# Patient Record
Sex: Male | Born: 2005 | Race: Black or African American | Hispanic: No | Marital: Single
Health system: Southern US, Community
[De-identification: ages and names within clinical notes are randomized; demographics above are authoritative.]

---
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
## 2023-10-06 DIAGNOSIS — H5213 Myopia, bilateral: Secondary | ICD-10-CM | POA: Diagnosis not present

## 2024-01-02 IMAGING — US US SCROTUM W/ DOPPLER COMPLETE
1 series · 13 of 25 positions shown · non-contrast
Comparison: None.

CLINICAL DATA: Right testicular pain for 3 hours, vomiting

EXAM:
SCROTAL ULTRASOUND
DOPPLER ULTRASOUND OF THE TESTICLES
TECHNIQUE: Complete ultrasound examination of the testicles, epididymis, and
other scrotal structures was performed. Color and spectral Doppler
ultrasound were also utilized to evaluate blood flow to the
testicles.

[Series 1: us scrotum w/doppler · 13 of 83 slices shown]
[im 1/83]
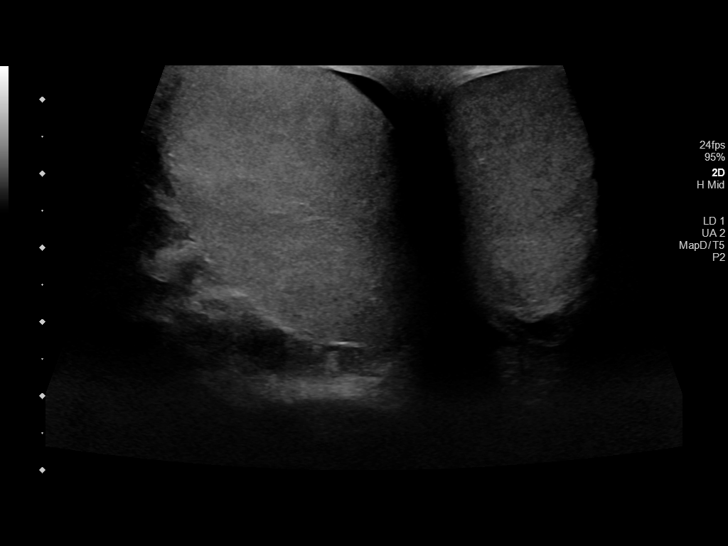
[im 7/83]
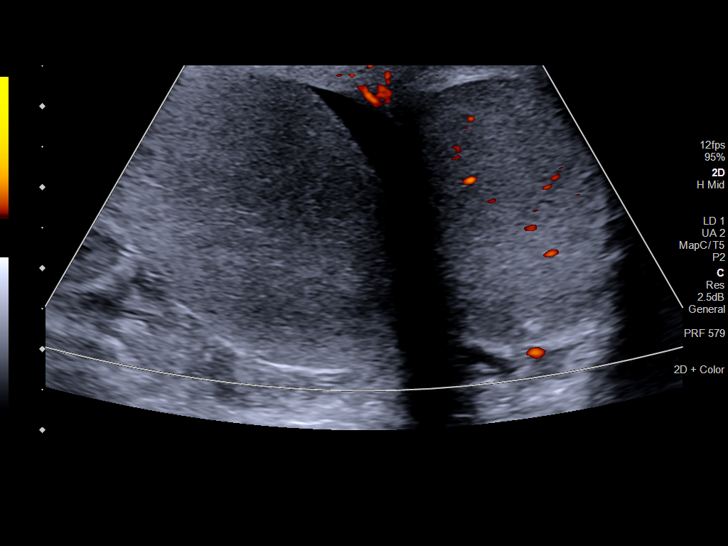
[im 14/83]
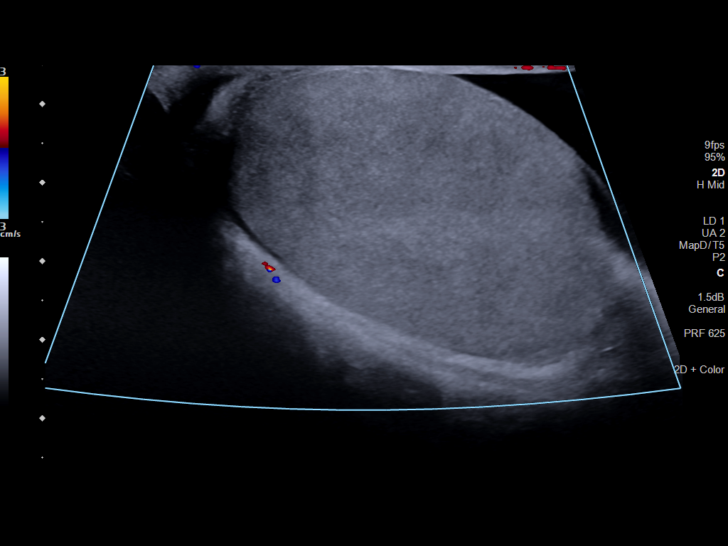
[im 21/83]
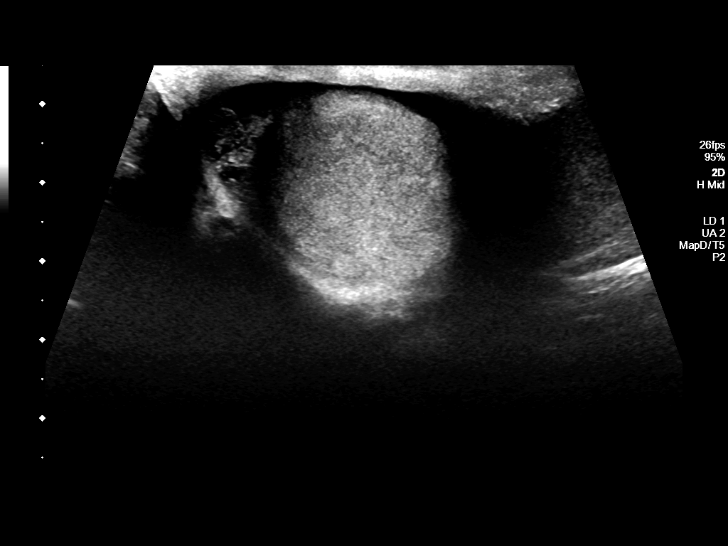
[im 28/83]
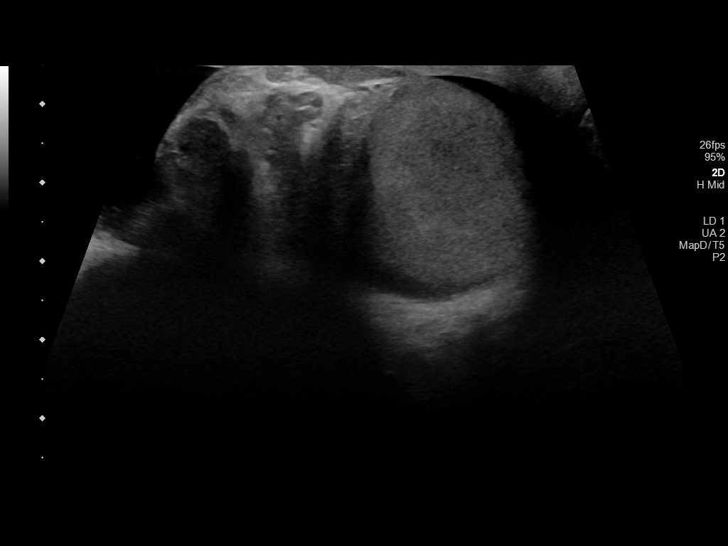
[im 35/83]
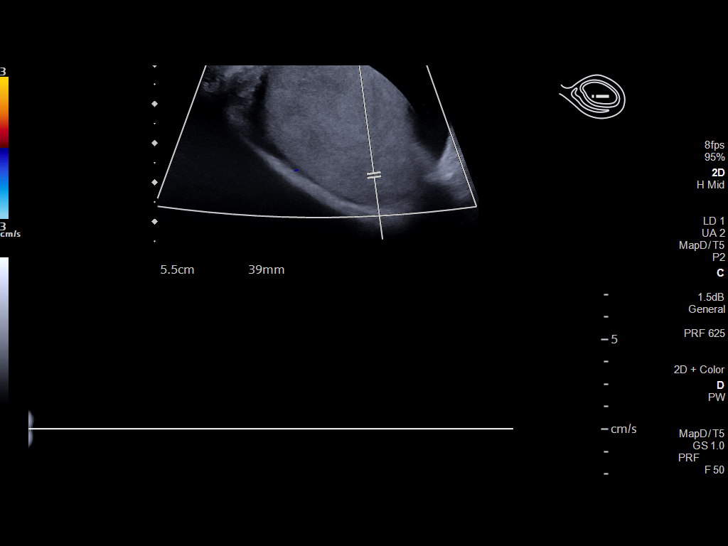
[im 42/83]
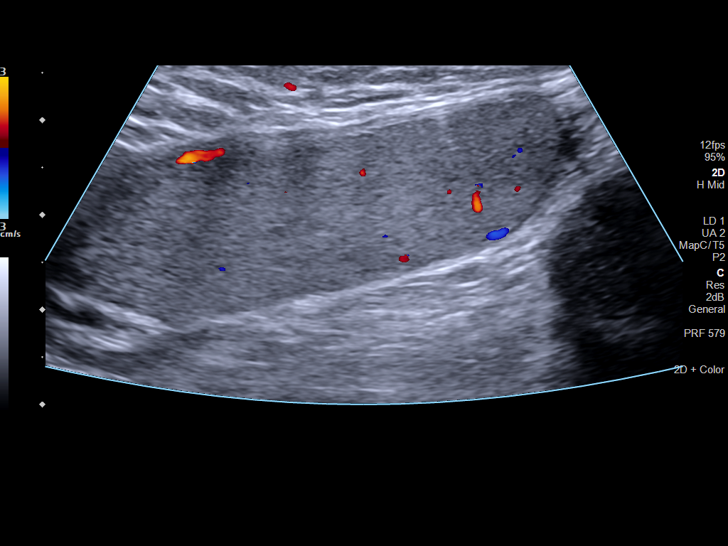
[im 48/83]
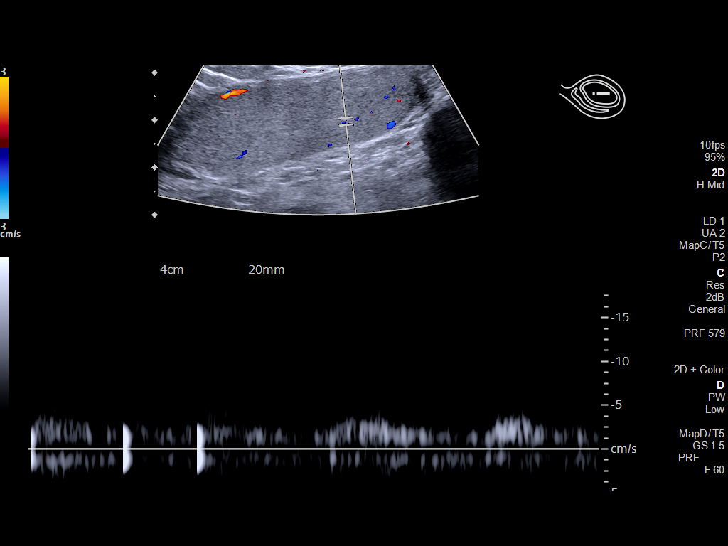
[im 55/83]
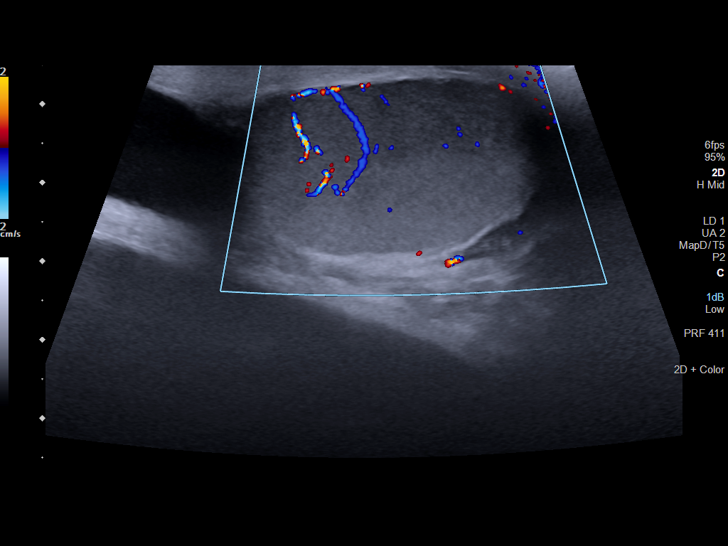
[im 62/83]
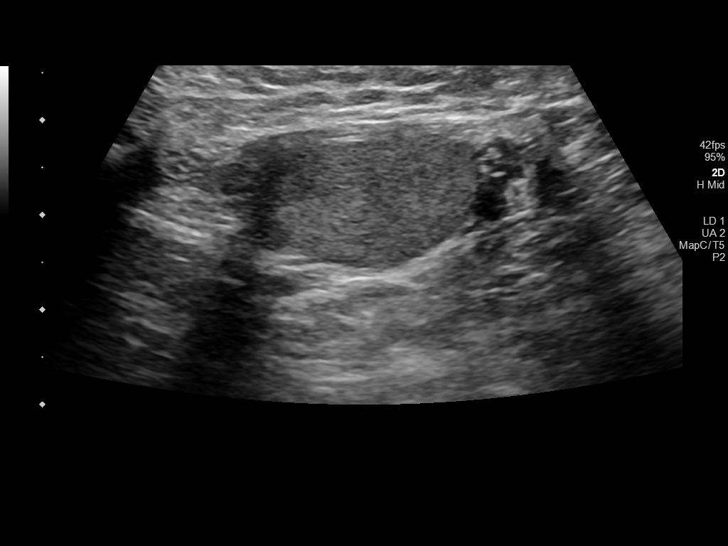
[im 69/83]
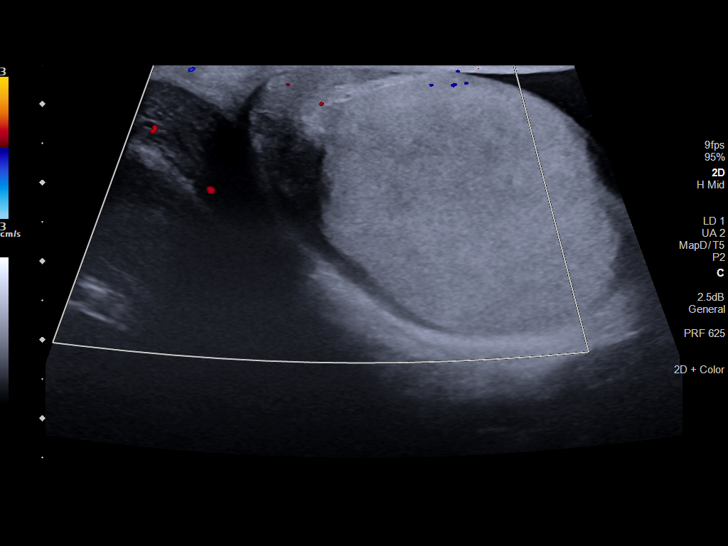
[im 76/83]
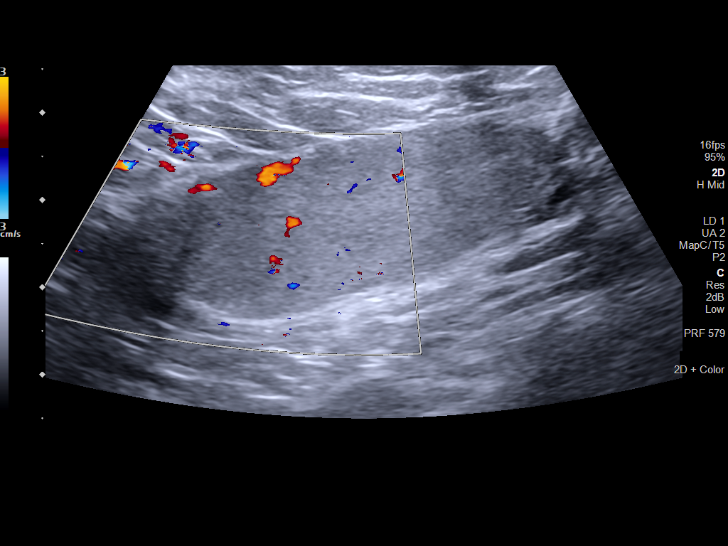
[im 83/83]
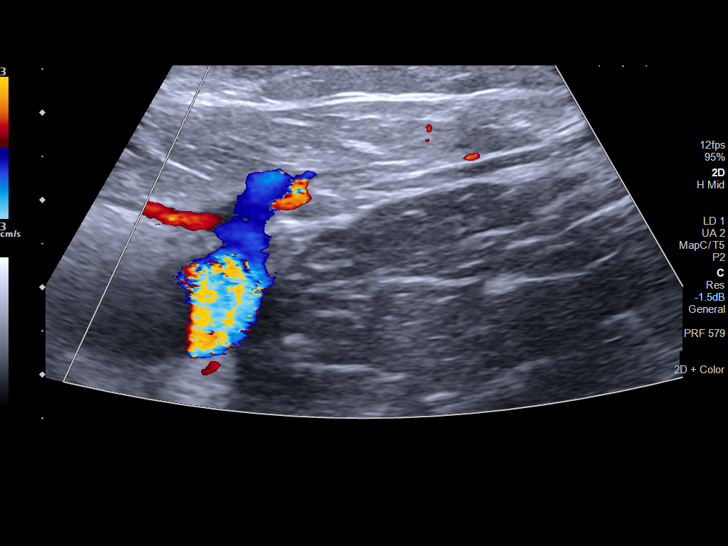

[13 of 25 positions shown; findings below may reference images not displayed]

FINDINGS: Right testicle

Measurements: 5.6 x 3.3 x 3.2 cm. Heterogeneous decreased
echotexture throughout the right testicle, which is enlarged
relative to the left, consistent with torsion. No focal mass or
microlithiasis.

Left testicle

Measurements: 5.5 x 1.5 by 2.9 cm. No mass or microlithiasis
visualized.

Right epididymis: Mildly enlarged measuring 1.9 x 1.0 by 1.9 cm,
with heterogeneous decreased echotexture. No discrete mass.

Left epididymis:  Normal in size and appearance.

Hydrocele:  Trace right hydrocele.  No left-sided hydrocele.

Varicocele:  None visualized.

Pulsed Doppler interrogation of both testes demonstrates normal low
resistance arterial and venous waveforms within the left testicle.
There is no internal color flow or Doppler waveform detected within
the right testicle, consistent with right testicular torsion.
IMPRESSION: 1. Findings compatible with right testicular torsion.
2. Trace right hydrocele.
3. Unremarkable left testicle.

Critical Value/emergent results were called by telephone at the time
of interpretation on 05/06/2021 at [DATE] to provider KARIM KARIM DE ROSSI ,
who verbally acknowledged these results.
